# Patient Record
Sex: Male | Born: 2000 | Race: White | Hispanic: No | Marital: Single | Smoking: Never smoker
Health system: Southern US, Community
[De-identification: ages and names within clinical notes are randomized; demographics above are authoritative.]

## PROBLEM LIST (undated history)

## (undated) DIAGNOSIS — T7840XA Allergy, unspecified, initial encounter: Secondary | ICD-10-CM

## (undated) DIAGNOSIS — L309 Dermatitis, unspecified: Secondary | ICD-10-CM

## (undated) DIAGNOSIS — F909 Attention-deficit hyperactivity disorder, unspecified type: Secondary | ICD-10-CM

## (undated) DIAGNOSIS — H539 Unspecified visual disturbance: Secondary | ICD-10-CM

## (undated) HISTORY — DX: Unspecified visual disturbance: H53.9

## (undated) HISTORY — PX: TYMPANOSTOMY TUBE PLACEMENT: SHX32

## (undated) HISTORY — DX: Attention-deficit hyperactivity disorder, unspecified type: F90.9

## (undated) HISTORY — DX: Allergy, unspecified, initial encounter: T78.40XA

## (undated) HISTORY — DX: Dermatitis, unspecified: L30.9

---
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
## 2020-01-15 ENCOUNTER — Ambulatory Visit (INDEPENDENT_AMBULATORY_CARE_PROVIDER_SITE_OTHER): Payer: BLUE CROSS/BLUE SHIELD

## 2020-01-15 ENCOUNTER — Ambulatory Visit: Payer: BLUE CROSS/BLUE SHIELD | Admitting: Internal Medicine

## 2020-01-15 ENCOUNTER — Encounter: Payer: Self-pay | Admitting: Internal Medicine

## 2020-01-15 ENCOUNTER — Other Ambulatory Visit: Payer: Self-pay

## 2020-01-15 VITALS — BP 102/72 | HR 67 | Temp 98.4°F | Ht 74.0 in | Wt 220.0 lb

## 2020-01-15 DIAGNOSIS — S93402A Sprain of unspecified ligament of left ankle, initial encounter: Secondary | ICD-10-CM

## 2020-01-15 DIAGNOSIS — R439 Unspecified disturbances of smell and taste: Secondary | ICD-10-CM | POA: Diagnosis not present

## 2020-01-15 DIAGNOSIS — Z00129 Encounter for routine child health examination without abnormal findings: Secondary | ICD-10-CM | POA: Insufficient documentation

## 2020-01-15 DIAGNOSIS — Z0000 Encounter for general adult medical examination without abnormal findings: Secondary | ICD-10-CM | POA: Insufficient documentation

## 2020-01-15 LAB — BASIC METABOLIC PANEL
BUN: 15 mg/dL (ref 6–23)
CO2: 32 mEq/L (ref 19–32)
Calcium: 9.8 mg/dL (ref 8.4–10.5)
Chloride: 103 mEq/L (ref 96–112)
Creatinine, Ser: 0.96 mg/dL (ref 0.40–1.50)
GFR: 101.44 mL/min (ref >60.00)
Glucose, Bld: 89 mg/dL (ref 70–99)
Potassium: 4 mEq/L (ref 3.5–5.1)
Sodium: 140 mEq/L (ref 135–145)

## 2020-01-15 LAB — CBC WITH DIFFERENTIAL/PLATELET
Basophils Absolute: 0 10*3/uL (ref 0.0–0.1)
Basophils Relative: 0.5 % (ref 0.0–3.0)
Eosinophils Absolute: 0.1 10*3/uL (ref 0.0–0.7)
Eosinophils Relative: 2.7 % (ref 0.0–5.0)
HCT: 44.9 % (ref 36.0–49.0)
Hemoglobin: 15.5 g/dL (ref 12.0–16.0)
Lymphocytes Relative: 33.7 % (ref 24.0–48.0)
Lymphs Abs: 1.7 10*3/uL (ref 0.7–4.0)
MCHC: 34.6 g/dL (ref 31.0–37.0)
MCV: 92.1 fl (ref 78.0–98.0)
Monocytes Absolute: 0.9 10*3/uL (ref 0.1–1.0)
Monocytes Relative: 16.8 % — ABNORMAL HIGH (ref 3.0–12.0)
Neutro Abs: 2.4 10*3/uL (ref 1.4–7.7)
Neutrophils Relative %: 46.3 % (ref 43.0–71.0)
Platelets: 217 10*3/uL (ref 150.0–575.0)
RBC: 4.87 Mil/uL (ref 3.80–5.70)
RDW: 12.3 % (ref 11.4–15.5)
WBC: 5.2 10*3/uL (ref 4.5–13.5)

## 2020-01-15 LAB — URINALYSIS
Bilirubin Urine: NEGATIVE
Hgb urine dipstick: NEGATIVE
Ketones, ur: NEGATIVE
Leukocytes,Ua: NEGATIVE
Nitrite: NEGATIVE
Specific Gravity, Urine: 1.02 (ref 1.000–1.030)
Total Protein, Urine: NEGATIVE
Urine Glucose: NEGATIVE
Urobilinogen, UA: 0.2 (ref 0.0–1.0)
pH: 6.5 (ref 5.0–8.0)

## 2020-01-15 LAB — VITAMIN B12: Vitamin B-12: 254 pg/mL (ref 211–911)

## 2020-01-15 LAB — HEPATIC FUNCTION PANEL
ALT: 22 U/L (ref 0–53)
AST: 17 U/L (ref 0–37)
Albumin: 4.3 g/dL (ref 3.5–5.2)
Alkaline Phosphatase: 73 U/L (ref 52–171)
Bilirubin, Direct: 0.1 mg/dL (ref 0.0–0.3)
Total Bilirubin: 0.4 mg/dL (ref 0.3–1.2)
Total Protein: 6.9 g/dL (ref 6.0–8.3)

## 2020-01-15 LAB — LIPID PANEL
Cholesterol: 113 mg/dL (ref 0–200)
HDL: 36.6 mg/dL — ABNORMAL LOW (ref >39.00)
LDL Cholesterol: 68 mg/dL (ref 0–99)
NonHDL: 76.11
Total CHOL/HDL Ratio: 3
Triglycerides: 40 mg/dL (ref 0.0–149.0)
VLDL: 8 mg/dL (ref 0.0–40.0)

## 2020-01-15 LAB — VITAMIN D 25 HYDROXY (VIT D DEFICIENCY, FRACTURES): VITD: 32.18 ng/mL (ref 30.00–100.00)

## 2020-01-15 LAB — TSH: TSH: 1.12 u[IU]/mL (ref 0.40–5.00)

## 2020-01-15 MED ORDER — VITAMIN D3 50 MCG (2000 UT) PO CAPS: 2000.0000 [IU] | ORAL_CAPSULE | Freq: Every day | ORAL | 3 refills | Status: DC

## 2020-01-15 MED ORDER — B COMPLEX PO TABS: 1.0000 | ORAL_TABLET | Freq: Every day | ORAL | 3 refills | Status: DC

## 2020-01-15 NOTE — Assessment & Plan Note (Signed)
post-COVID19 B complex Labs

## 2020-01-15 NOTE — Assessment & Plan Note (Addendum)
Labs Vit D We discussed age appropriate health related issues, including available/recomended screening tests and vaccinations. We discussed a need for adhering to healthy diet and exercise. Labs were reviewed/ordered. All questions were answered. Age and sex related issues discussed (safe sex, seat belt use, etc.).

## 2020-01-15 NOTE — Progress Notes (Signed)
Subjective:  Patient ID: Shane Stephens, male    DOB: [birth year]  Age: 19 y.o. MRN: [id number]  CC: New Patient (Initial Visit)   HPI Shane Stephens presents for a new pt visit - well exam Pt had COVID19 6 wks ago C/o L ankle sprain - severe Torn ACL L - s/p surgery  Outpatient Medications Prior to Visit  Medication Sig Dispense Refill  . neomycin-polymyxin-hydrocortisone (CORTISPORIN) OTIC solution Apply 1-2 drops to toe after soaking twice a day 10 mL 0   No facility-administered medications prior to visit.    ROS: Review of Systems  Constitutional: Negative for appetite change, fatigue and unexpected weight change.  HENT: Negative for congestion, nosebleeds, sneezing, sore throat and trouble swallowing.   Eyes: Negative for itching and visual disturbance.  Respiratory: Negative for cough.   Cardiovascular: Negative for chest pain, palpitations and leg swelling.  Gastrointestinal: Negative for abdominal distention, blood in stool, diarrhea and nausea.  Genitourinary: Negative for frequency and hematuria.  Musculoskeletal: Positive for gait problem. Negative for back pain, joint swelling and neck pain.  Skin: Negative for rash.  Neurological: Negative for dizziness, tremors, speech difficulty and weakness.  Psychiatric/Behavioral: Negative for agitation, dysphoric mood and sleep disturbance. The patient is not nervous/anxious.     Objective:  BP 102/72 (BP Location: Left Arm)   Pulse 67   Temp 98.4 F (36.9 C) (Oral)   Ht 6\' 2"  (1.88 m)   Wt 220 lb (99.8 kg)   SpO2 97%   BMI 28.25 kg/m   BP Readings from Last 3 Encounters:  01/15/20 102/72  06/06/19 114/67    Wt Readings from Last 3 Encounters:  01/15/20 220 lb (99.8 kg) (97 %, Z= 1.94)*   * Growth percentiles are based on CDC (Boys, 2-20 Years) data.    Physical Exam Constitutional:      General: He is not in acute distress.    Appearance: He is well-developed.     Comments: NAD  Eyes:   Conjunctiva/sclera: Conjunctivae normal.     Pupils: Pupils are equal, round, and reactive to light.  Neck:     Thyroid: No thyromegaly.     Vascular: No JVD.  Cardiovascular:     Rate and Rhythm: Normal rate and regular rhythm.     Heart sounds: Normal heart sounds. No murmur. No friction rub. No gallop.   Pulmonary:     Effort: Pulmonary effort is normal. No respiratory distress.     Breath sounds: Normal breath sounds. No wheezing or rales.  Chest:     Chest wall: No tenderness.  Abdominal:     General: Bowel sounds are normal. There is no distension.     Palpations: Abdomen is soft. There is no mass.     Tenderness: There is no abdominal tenderness. There is no guarding or rebound.  Musculoskeletal:        General: No tenderness. Normal range of motion.     Cervical back: Normal range of motion.  Lymphadenopathy:     Cervical: No cervical adenopathy.  Skin:    General: Skin is warm and dry.     Findings: No rash.  Neurological:     Mental Status: He is alert and oriented to person, place, and time.     Cranial Nerves: No cranial nerve deficit.     Motor: No abnormal muscle tone.     Coordination: Coordination normal.     Gait: Gait normal.     Deep Tendon Reflexes: Reflexes  are normal and symmetric.  Psychiatric:        Behavior: Behavior normal.        Thought Content: Thought content normal.        Judgment: Judgment normal.   in a surg boot L ankle w/lat swelling  Testes - self exam  No results found for: WBC, HGB, HCT, PLT, GLUCOSE, CHOL, TRIG, HDL, LDLDIRECT, LDLCALC, ALT, AST, NA, K, CL, CREATININE, BUN, CO2, TSH, PSA, INR, GLUF, HGBA1C, MICROALBUR  No results found.  Assessment & Plan:     Follow-up: No follow-ups on file.  Sonda Primes, MD

## 2020-01-15 NOTE — Assessment & Plan Note (Signed)
Boot Ankle X ray

## 2020-01-20 ENCOUNTER — Encounter: Payer: Self-pay | Admitting: Internal Medicine

## 2020-07-10 ENCOUNTER — Ambulatory Visit: Payer: BLUE CROSS/BLUE SHIELD | Admitting: Family

## 2020-08-03 ENCOUNTER — Other Ambulatory Visit: Payer: Self-pay

## 2020-08-03 ENCOUNTER — Ambulatory Visit (INDEPENDENT_AMBULATORY_CARE_PROVIDER_SITE_OTHER): Payer: BLUE CROSS/BLUE SHIELD | Admitting: Family

## 2020-08-03 VITALS — HR 65 | Temp 98.7°F

## 2020-08-03 DIAGNOSIS — K649 Unspecified hemorrhoids: Secondary | ICD-10-CM | POA: Diagnosis not present

## 2020-08-03 DIAGNOSIS — J069 Acute upper respiratory infection, unspecified: Secondary | ICD-10-CM

## 2020-08-03 MED ORDER — HYDROCORT-PRAMOXINE (PERIANAL) 1-1 % EX FOAM: 1.0000 | Freq: Three times a day (TID) | CUTANEOUS | 0 refills | Status: DC

## 2020-08-03 NOTE — Progress Notes (Signed)
Shane Stephens is a 19 y.o. male with the following history as recorded in [employer]:  Patient Active Problem List   Diagnosis Date Noted  . 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 Well adolescent visit 01/15/2020  . Muscle weakness 05/30/2019  . History of reconstruction of anterior cruciate ligament tear 05/10/2019  . Pain in left knee 10/15/2018  . Left ankle sprain 02/21/2018  . ADHD (attention deficit hyperactivity disorder), combined type 02/15/2016  . Developmental dysgraphia 02/15/2016    Current Outpatient Medications  Medication Sig Dispense Refill  . b complex vitamins tablet Take 1 tablet by mouth daily. 100 tablet 3  . Cholecalciferol (VITAMIN D3) 50 MCG (2000 UT) capsule Take 1 capsule (2,000 Units total) by mouth daily. 100 capsule 3  . hydrocortisone-pramoxine (PROCTOFOAM-HC) rectal foam Place 1 applicator rectally 3 (three) times daily. 10 g 0   No current facility-administered medications for this visit.    Allergies: Cat hair extract  Past Medical History:  Diagnosis Date  . ADHD (attention deficit hyperactivity disorder)   . Allergy    environmental allergies  . Eczema   . Vision abnormalities    myopia    Past Surgical History:  Procedure Laterality Date  . TYMPANOSTOMY TUBE PLACEMENT     1 yr    Family History  Problem Relation Age of Onset  . Hypertension Maternal Aunt   . Hypertension Maternal Grandmother   . Heart disease Maternal Grandmother   . Stroke Maternal Grandmother   . Depression Maternal Grandfather   . Arthritis Maternal Aunt   . Fibromyalgia Maternal Aunt     Social History   Tobacco Use  . Smoking status: Never Smoker  . Smokeless tobacco: Never Used  Substance Use Topics  . Alcohol use: No    Alcohol/week: 0.0 standard drinks    Subjective:   I connected with Shane Stephens on 08/03/20 at  9:00 AM EDT by a video enabled telemedicine application and verified that I am speaking with the correct person using two  identifiers.   I discussed the limitations of evaluation and management by telemedicine and the availability of in person appointments. The patient expressed understanding and agreed to proceed. Provider in office/ patient is at home; provider and patient are only 2 people on video call.   1) Concerned for hemorrhoids; having some pain/ bright bleeding with bowel movements x 1 months; does admits to some occasional straining; no prior history of problems with hemorrhoids; no OTC medications tried;  2) Complaining of sore throat, cough/ runny nose x 1 week; is fully vaccinated; notes that suite mates have similar symptoms; one tested negative; feels like it is "just a normal cold." No fever, chest pain, shortness of breath;       Objective:  Vitals:   08/03/20 0903  Pulse: 65  Temp: 98.7 F (37.1 C)  TempSrc: Oral  SpO2: 97%    General: Well developed, well nourished, in no acute distress  Head: Normocephalic and atraumatic  Lungs: Respirations unlabored;  Neurologic: Alert and oriented; speech intact; face symmetrical; moves all extremities well; CNII-XII intact without focal deficit   Assessment:  1. Viral URI   2. Hemorrhoids, unspecified hemorrhoid type     Plan:  1. Due to patient's symptoms and increased risk for exposure due to recent attendance at large football game with no mask, will go ahead and test for COVID; symptomatic treatment discussed; 2. Rx for Proctofoam HC apply tid; need for healthy diet, increased water; follow-up  worse, no better.   No follow-ups on file.  Orders Placed This Encounter  Procedures  . Novel Coronavirus, NAA ([facility])    Order Specific Question:   Is this test for diagnosis or screening    Answer:   Diagnosis of ill patient    Order Specific Question:   Symptomatic for COVID-19 as defined by CDC    Answer:   Yes    Order Specific Question:   Date of Symptom Onset    Answer:   07/27/2020    Order Specific Question:   Hospitalized for  COVID-19    Answer:   No    Order Specific Question:   Admitted to ICU for COVID-19    Answer:   No    Order Specific Question:   Previously tested for COVID-19    Answer:   Yes    Order Specific Question:   Resident in a congregate (group) care setting    Answer:   No    Order Specific Question:   Is the patient student?    Answer:   Yes    Order Specific Question:   Employed in healthcare setting    Answer:   No    Order Specific Question:   Has patient completed COVID vaccination(s) (2 doses of Pfizer/Moderna 1 dose of [employer])    Answer:   Yes    Requested Prescriptions   Signed Prescriptions Disp Refills  . hydrocortisone-pramoxine (PROCTOFOAM-HC) rectal foam 10 g 0    Sig: Place 1 applicator rectally 3 (three) times daily.

## 2020-08-05 LAB — NOVEL CORONAVIRUS, NAA: SARS-CoV-2, NAA: NOT DETECTED

## 2020-08-05 LAB — SARS-COV-2, NAA 2 DAY TAT

## 2021-05-26 IMAGING — DX DG ANKLE COMPLETE 3+V*L*
3 series · 3 of 3 positions shown · non-contrast
Comparison: None.

CLINICAL DATA: Left ankle injury

EXAM:
LEFT ANKLE COMPLETE - 3+ VIEW

[ankle ap]
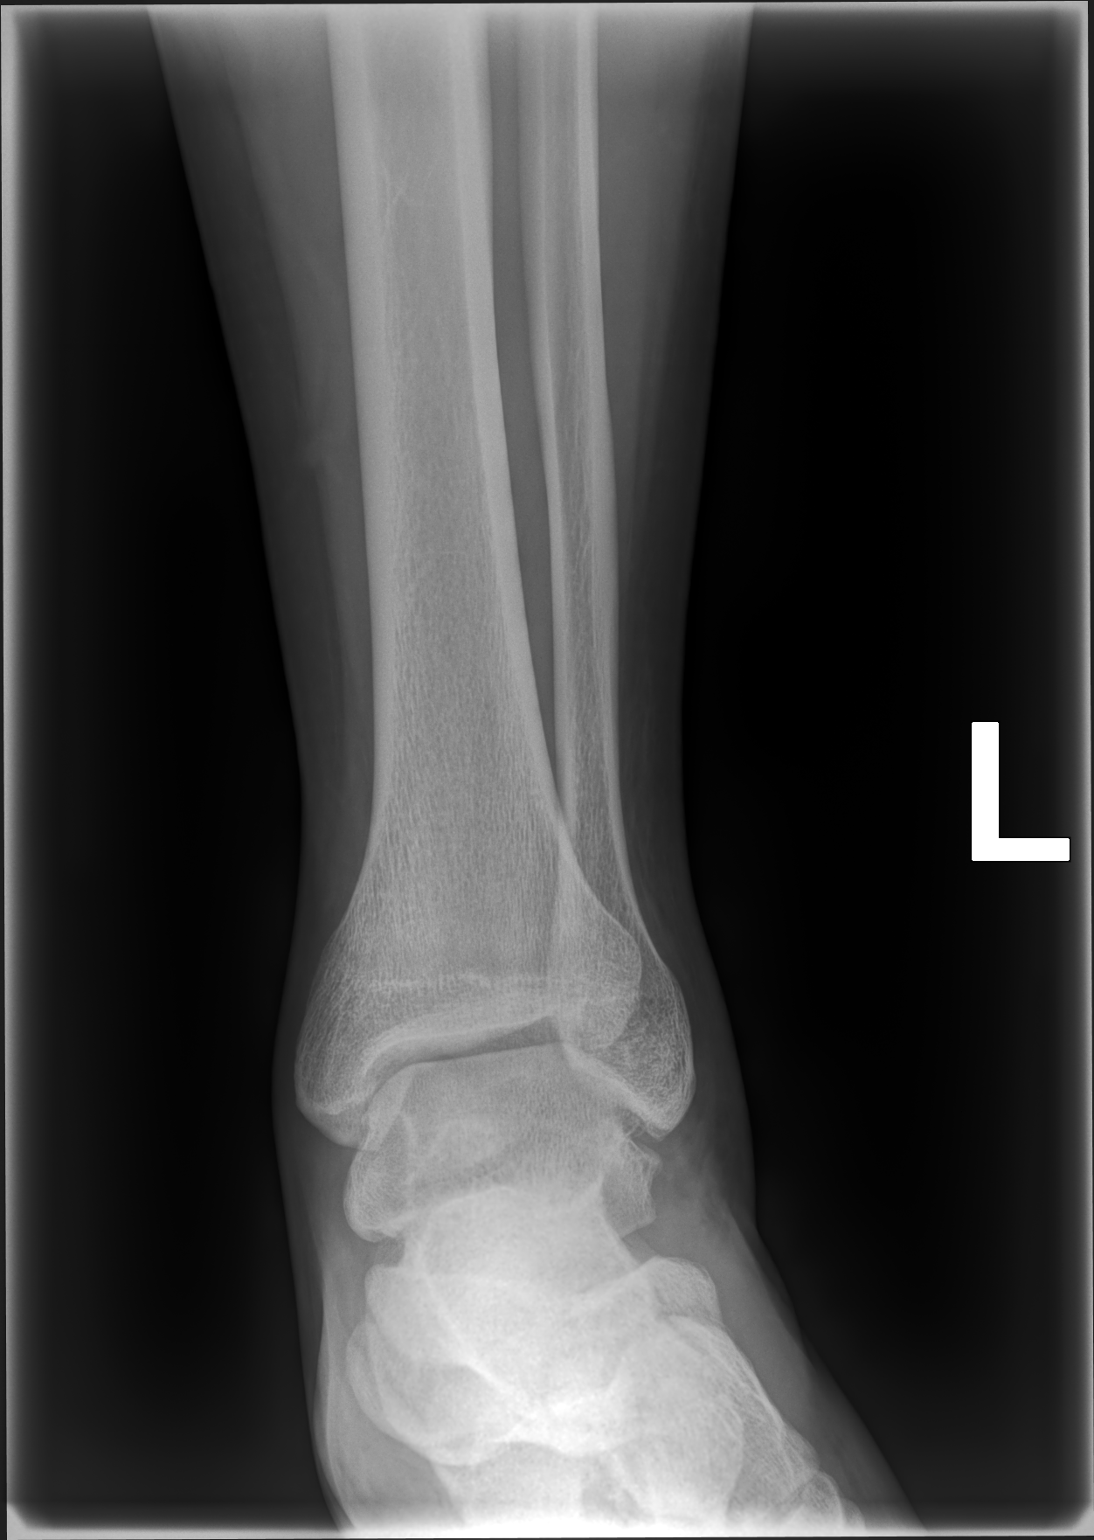

[ankle mlo]
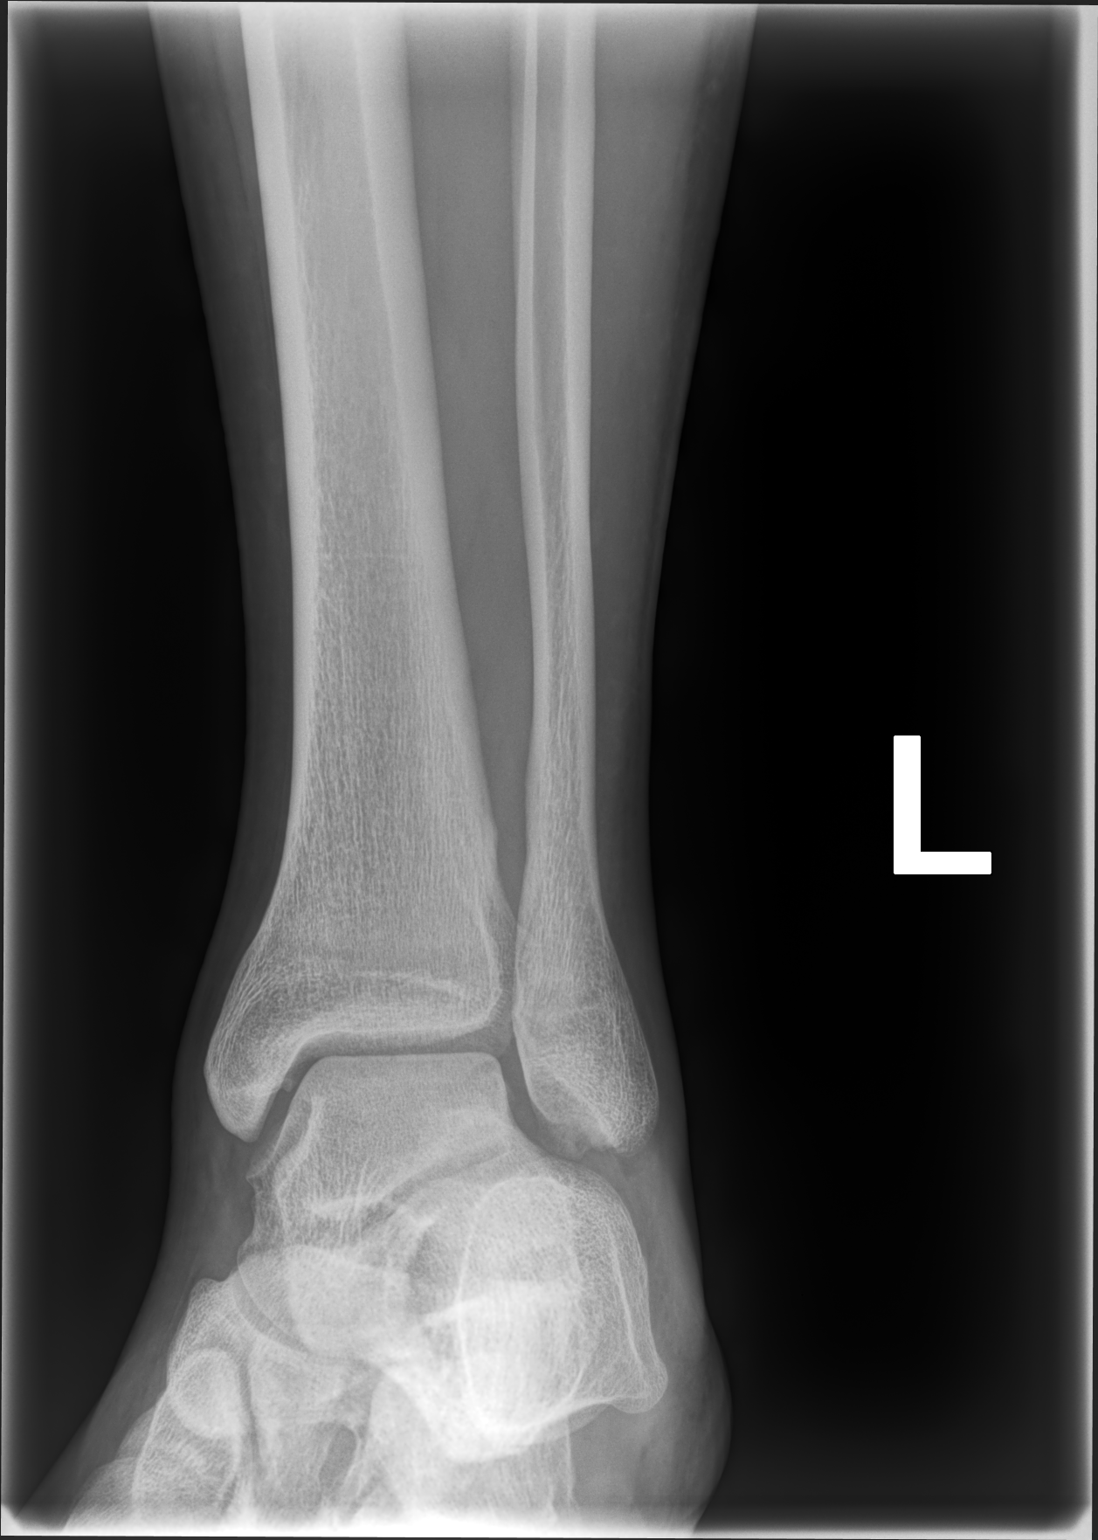

[ankle lat]
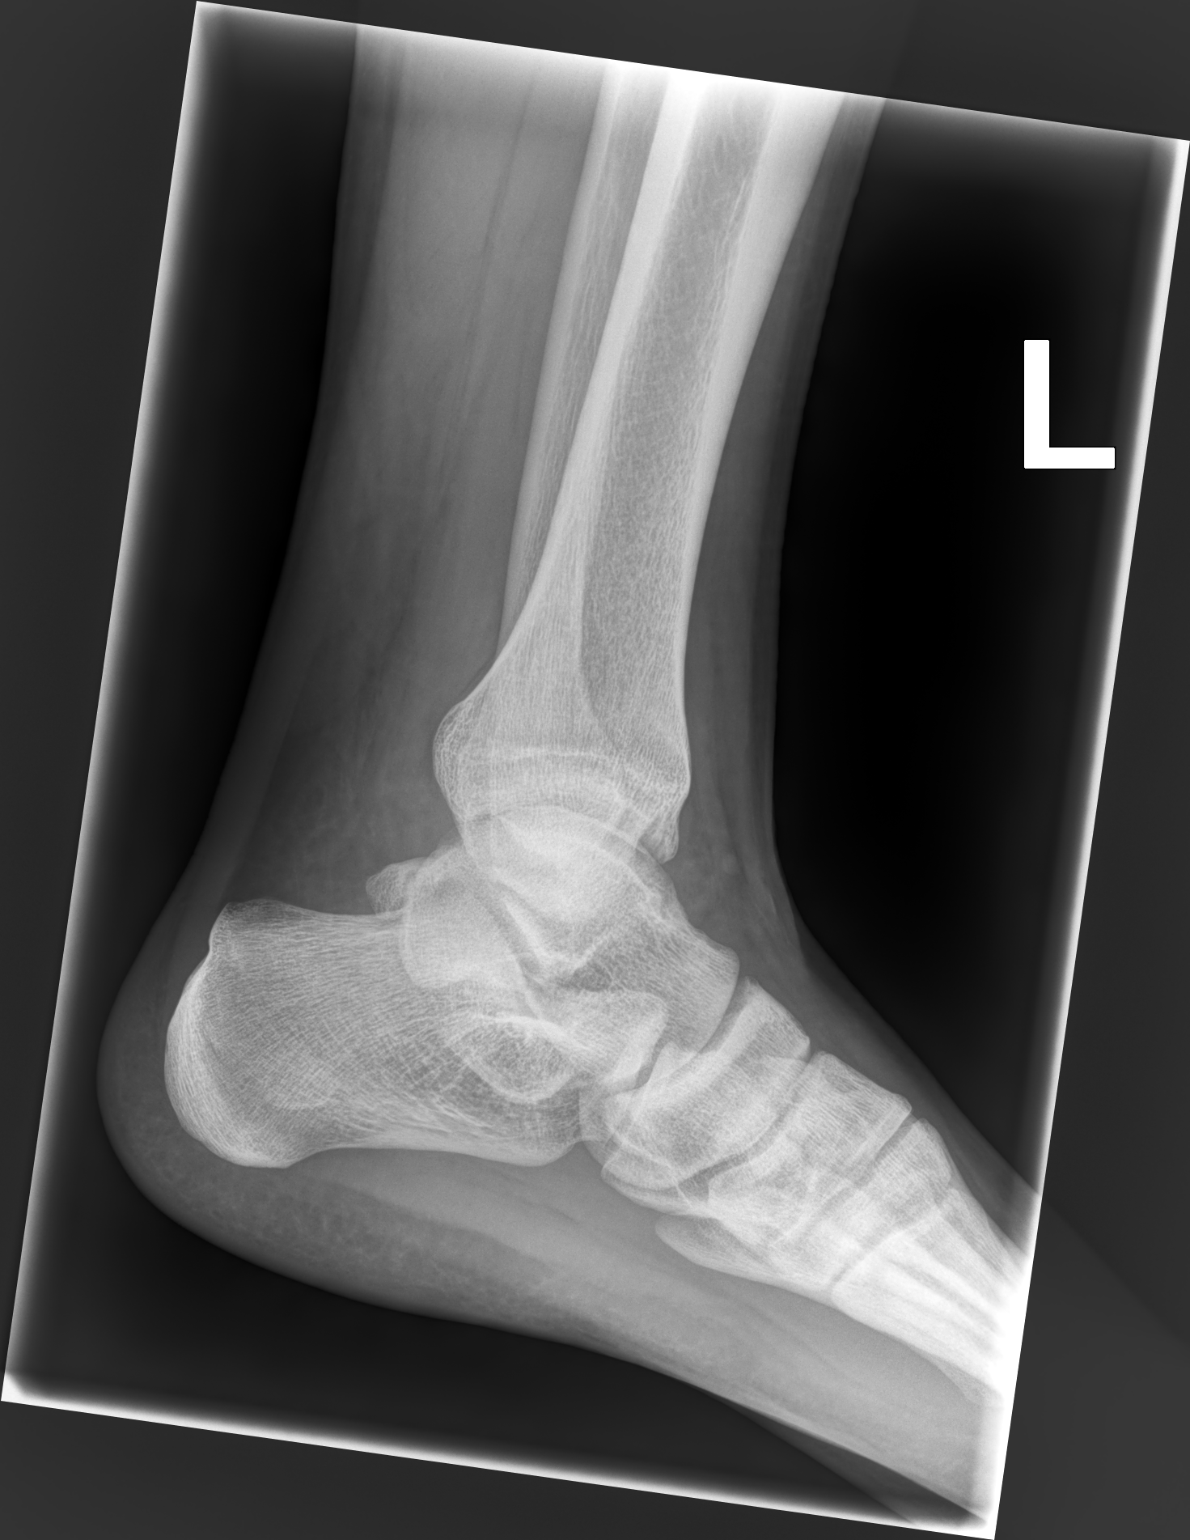

[3 of 3 positions shown; findings below may reference images not displayed]

FINDINGS: There is no evidence of fracture, dislocation, or joint effusion.
There is no evidence of arthropathy or other focal bone abnormality.
Soft tissues are unremarkable.
IMPRESSION: Negative.

## 2023-05-31 ENCOUNTER — Ambulatory Visit (INDEPENDENT_AMBULATORY_CARE_PROVIDER_SITE_OTHER): Payer: BLUE CROSS/BLUE SHIELD | Admitting: Internal Medicine

## 2023-05-31 ENCOUNTER — Encounter: Payer: Self-pay | Admitting: Internal Medicine

## 2023-05-31 VITALS — BP 120/82 | HR 66 | Temp 98.6°F | Ht 74.0 in | Wt 261.0 lb

## 2023-05-31 DIAGNOSIS — J3081 Allergic rhinitis due to animal (cat) (dog) hair and dander: Secondary | ICD-10-CM | POA: Diagnosis not present

## 2023-05-31 DIAGNOSIS — M238X9 Other internal derangements of unspecified knee: Secondary | ICD-10-CM

## 2023-05-31 DIAGNOSIS — Z0000 Encounter for general adult medical examination without abnormal findings: Secondary | ICD-10-CM

## 2023-05-31 DIAGNOSIS — M25562 Pain in left knee: Secondary | ICD-10-CM

## 2023-05-31 DIAGNOSIS — Z13220 Encounter for screening for lipoid disorders: Secondary | ICD-10-CM

## 2023-05-31 DIAGNOSIS — Z0001 Encounter for general adult medical examination with abnormal findings: Secondary | ICD-10-CM

## 2023-05-31 DIAGNOSIS — G8929 Other chronic pain: Secondary | ICD-10-CM

## 2023-05-31 LAB — URINALYSIS
Bilirubin Urine: NEGATIVE
Hgb urine dipstick: NEGATIVE
Ketones, ur: NEGATIVE
Leukocytes,Ua: NEGATIVE
Nitrite: NEGATIVE
Specific Gravity, Urine: 1.025 (ref 1.000–1.030)
Total Protein, Urine: NEGATIVE
Urine Glucose: NEGATIVE
Urobilinogen, UA: 0.2 (ref 0.0–1.0)
pH: 6 (ref 5.0–8.0)

## 2023-05-31 LAB — LIPID PANEL
Cholesterol: 122 mg/dL (ref 0–200)
HDL: 28.8 mg/dL — ABNORMAL LOW (ref >39.00)
LDL Cholesterol: 87 mg/dL (ref 0–99)
NonHDL: 93.45
Total CHOL/HDL Ratio: 4
Triglycerides: 33 mg/dL (ref 0.0–149.0)
VLDL: 6.6 mg/dL (ref 0.0–40.0)

## 2023-05-31 LAB — CBC WITH DIFFERENTIAL/PLATELET
Basophils Absolute: 0 10*3/uL (ref 0.0–0.1)
Basophils Relative: 0.5 % (ref 0.0–3.0)
Eosinophils Absolute: 0.1 10*3/uL (ref 0.0–0.7)
Eosinophils Relative: 2 % (ref 0.0–5.0)
HCT: 43.3 % (ref 39.0–52.0)
Hemoglobin: 14.9 g/dL (ref 13.0–17.0)
Lymphocytes Relative: 36.9 % (ref 12.0–46.0)
Lymphs Abs: 2.2 10*3/uL (ref 0.7–4.0)
MCHC: 34.4 g/dL (ref 30.0–36.0)
MCV: 90.1 fl (ref 78.0–100.0)
Monocytes Absolute: 0.7 10*3/uL (ref 0.1–1.0)
Monocytes Relative: 11.2 % (ref 3.0–12.0)
Neutro Abs: 3 10*3/uL (ref 1.4–7.7)
Neutrophils Relative %: 49.4 % (ref 43.0–77.0)
Platelets: 284 10*3/uL (ref 150.0–400.0)
RBC: 4.81 Mil/uL (ref 4.22–5.81)
RDW: 12.2 % (ref 11.5–15.5)
WBC: 6 10*3/uL (ref 4.0–10.5)

## 2023-05-31 LAB — VITAMIN D 25 HYDROXY (VIT D DEFICIENCY, FRACTURES): VITD: 28.77 ng/mL — ABNORMAL LOW (ref 30.00–100.00)

## 2023-05-31 LAB — COMPREHENSIVE METABOLIC PANEL
ALT: 28 U/L (ref 0–53)
AST: 21 U/L (ref 0–37)
Albumin: 4.3 g/dL (ref 3.5–5.2)
Alkaline Phosphatase: 73 U/L (ref 39–117)
BUN: 15 mg/dL (ref 6–23)
CO2: 28 mEq/L (ref 19–32)
Calcium: 9.5 mg/dL (ref 8.4–10.5)
Chloride: 106 mEq/L (ref 96–112)
Creatinine, Ser: 0.96 mg/dL (ref 0.40–1.50)
GFR: 112.69 mL/min (ref >60.00)
Glucose, Bld: 95 mg/dL (ref 70–99)
Potassium: 4.2 mEq/L (ref 3.5–5.1)
Sodium: 140 mEq/L (ref 135–145)
Total Bilirubin: 0.5 mg/dL (ref 0.2–1.2)
Total Protein: 7.2 g/dL (ref 6.0–8.3)

## 2023-05-31 LAB — TSH: TSH: 0.55 u[IU]/mL (ref 0.35–5.50)

## 2023-05-31 MED ORDER — LORATADINE 10 MG PO TABS: 10.0000 mg | ORAL_TABLET | Freq: Every day | ORAL | Status: AC

## 2023-05-31 MED ORDER — VITAMIN D3 50 MCG (2000 UT) PO CAPS: 2000.0000 [IU] | ORAL_CAPSULE | Freq: Every day | ORAL | Status: AC

## 2023-05-31 NOTE — Progress Notes (Signed)
Subjective:  Patient ID: Shane Stephens, male    DOB: 2001/03/22  Age: 22 y.o. MRN: [id number]  CC: New Patient (Initial Visit)   HPI Shane Stephens presents for a well exam Occ R knee pain Not seen in>3 years  Outpatient Medications Prior to Visit  Medication Sig Dispense Refill   azelastine (OPTIVAR) 0.05 % ophthalmic solution 1 drop into affected eye Ophthalmic Twice a day     cetirizine (ZYRTEC ALLERGY) 10 MG tablet 1 tablet Orally Once a day     hydrocortisone-pramoxine (PROCTOFOAM-HC) rectal foam Place 1 applicator rectally 3 (three) times daily. 10 g 0   trimethoprim-polymyxin b (POLYTRIM) ophthalmic solution 1 drop 4 (four) times daily.     zolpidem (AMBIEN) 10 MG tablet Take 10 mg by mouth at bedtime.     No facility-administered medications prior to visit.    ROS: Review of Systems  Constitutional:  Negative for appetite change, fatigue and unexpected weight change.  HENT:  Negative for congestion, nosebleeds, sneezing, sore throat and trouble swallowing.   Eyes:  Negative for itching and visual disturbance.  Respiratory:  Negative for cough.   Cardiovascular:  Negative for chest pain, palpitations and leg swelling.  Gastrointestinal:  Negative for abdominal distention, blood in stool, diarrhea and nausea.  Genitourinary:  Negative for frequency and hematuria.  Musculoskeletal:  Negative for back pain, gait problem, joint swelling and neck pain.  Skin:  Negative for rash.  Neurological:  Negative for dizziness, tremors, speech difficulty and weakness.  Psychiatric/Behavioral:  Negative for agitation, dysphoric mood and sleep disturbance. The patient is not nervous/anxious.     Objective:  BP 120/82 (BP Location: Left Arm, Patient Position: Sitting, Cuff Size: Large)   Pulse 66   Temp 98.6 F (37 C) (Oral)   Ht 6\' 2"  (1.88 m)   Wt 261 lb (118.4 kg)   SpO2 96%   BMI 33.51 kg/m   BP Readings from Last 3 Encounters:  05/31/23 120/82  01/15/20 102/72   06/06/19 114/67    Wt Readings from Last 3 Encounters:  05/31/23 261 lb (118.4 kg)  01/15/20 220 lb (99.8 kg) (97%, Z= 1.94)*   * Growth percentiles are based on CDC (Boys, 2-20 Years) data.    Physical Exam Constitutional:      General: He is not in acute distress.    Appearance: He is well-developed.     Comments: NAD  Eyes:     Conjunctiva/sclera: Conjunctivae normal.     Pupils: Pupils are equal, round, and reactive to light.  Neck:     Thyroid: No thyromegaly.     Vascular: No JVD.  Cardiovascular:     Rate and Rhythm: Normal rate and regular rhythm.     Heart sounds: Normal heart sounds. No murmur heard.    No friction rub. No gallop.  Pulmonary:     Effort: Pulmonary effort is normal. No respiratory distress.     Breath sounds: Normal breath sounds. No wheezing or rales.  Chest:     Chest wall: No tenderness.  Abdominal:     General: Bowel sounds are normal. There is no distension.     Palpations: Abdomen is soft. There is no mass.     Tenderness: There is no abdominal tenderness. There is no guarding or rebound.  Musculoskeletal:        General: No tenderness. Normal range of motion.     Cervical back: Normal range of motion.  Lymphadenopathy:     Cervical: No  cervical adenopathy.  Skin:    General: Skin is warm and dry.     Findings: No rash.  Neurological:     Mental Status: He is alert and oriented to person, place, and time.     Cranial Nerves: No cranial nerve deficit.     Motor: No abnormal muscle tone.     Coordination: Coordination normal.     Gait: Gait normal.     Deep Tendon Reflexes: Reflexes are normal and symmetric.  Psychiatric:        Behavior: Behavior normal.        Thought Content: Thought content normal.        Judgment: Judgment normal.   Testes - self exam  Lab Results  Component Value Date   WBC 5.2 01/15/2020   HGB 15.5 01/15/2020   HCT 44.9 01/15/2020   PLT 217.0 01/15/2020   GLUCOSE 89 01/15/2020   CHOL 113 01/15/2020    TRIG 40.0 01/15/2020   HDL 36.60 (L) 01/15/2020   LDLCALC 68 01/15/2020   ALT 22 01/15/2020   AST 17 01/15/2020   NA 140 01/15/2020   K 4.0 01/15/2020   CL 103 01/15/2020   CREATININE 0.96 01/15/2020   BUN 15 01/15/2020   CO2 32 01/15/2020   TSH 1.12 01/15/2020    No results found.  Assessment & Plan:   Problem List Items Addressed This Visit     Pain in left knee   Relevant Orders   VITAMIN D 25 Hydroxy (Vit-D Deficiency, Fractures)   Well adult exam - Primary    We discussed age appropriate health related issues, including available/recomended screening tests and vaccinations. We discussed a need for adhering to healthy diet and exercise. Labs were reviewed/ordered. All questions were answered. Age and sex related issues discussed (safe sex, seat belt use, etc.).  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   loratadine (CLARITIN) 10 MG tablet    Sig: Take 1 tablet (10 mg total) by mouth daily.      Follow-up: Return in about 1 year (around 05/30/2024) for Wellness Exam.  Sonda Primes, MD

## 2023-05-31 NOTE — Assessment & Plan Note (Signed)
R and L s/p repair

## 2023-05-31 NOTE — Assessment & Plan Note (Signed)
Take Loratadine qd

## 2023-05-31 NOTE — Assessment & Plan Note (Addendum)
We discussed age appropriate health related issues, including available/recomended screening tests and vaccinations. We discussed a need for adhering to healthy diet and exercise. Labs were reviewed/ordered. All questions were answered. Age and sex related issues discussed (safe sex, seat belt use, etc.).  Vit D daily

## 2023-06-01 ENCOUNTER — Encounter: Payer: Self-pay | Admitting: Internal Medicine

## 2023-06-01 DIAGNOSIS — E559 Vitamin D deficiency, unspecified: Secondary | ICD-10-CM | POA: Insufficient documentation

## 2023-12-25 ENCOUNTER — Ambulatory Visit (INDEPENDENT_AMBULATORY_CARE_PROVIDER_SITE_OTHER): Payer: BLUE CROSS/BLUE SHIELD | Admitting: Podiatry

## 2023-12-25 ENCOUNTER — Encounter: Payer: Self-pay | Admitting: Podiatry

## 2023-12-25 ENCOUNTER — Ambulatory Visit (INDEPENDENT_AMBULATORY_CARE_PROVIDER_SITE_OTHER): Payer: BLUE CROSS/BLUE SHIELD

## 2023-12-25 DIAGNOSIS — M7672 Peroneal tendinitis, left leg: Secondary | ICD-10-CM | POA: Diagnosis not present

## 2023-12-25 DIAGNOSIS — M79672 Pain in left foot: Secondary | ICD-10-CM

## 2023-12-25 MED ORDER — DICLOFENAC SODIUM 75 MG PO TBEC: 75.0000 mg | DELAYED_RELEASE_TABLET | Freq: Two times a day (BID) | ORAL | 2 refills | Status: AC

## 2023-12-25 MED ORDER — TRIAMCINOLONE ACETONIDE 10 MG/ML IJ SUSP
10.0000 mg | Freq: Once | INTRAMUSCULAR | Status: AC
Start: 2023-12-25 — End: 2023-12-25
  Administered 2023-12-25: 10 mg via INTRA_ARTICULAR

## 2023-12-25 NOTE — Progress Notes (Signed)
 Subjective:   Patient ID: Shane Stephens, male   DOB: 23 y.o.   MRN: [id number]   HPI Patient states she has had a lot of pain in the outside of his left foot for the last few weeks and does not remember specific injury.  States that it is worse as he walks more and seems to get better as he is off of it.  Patient does not smoke likes to be active   Review of Systems  All other systems reviewed and are negative.       Objective:  Physical Exam Vitals and nursing note reviewed.  Constitutional:      Appearance: He is well-developed.  Pulmonary:     Effort: Pulmonary effort is normal.  Musculoskeletal:        General: Normal range of motion.  Skin:    General: Skin is warm.  Neurological:     Mental Status: He is alert.     Neurovascular status intact muscle strength within normal limits range of motion within normal limits with patient found to have inflammation pain on the lateral side left foot painful when pressed at the peroneal insertion base of fifth metatarsal with mild edema around the area and no indication of tendon dysfunction     Assessment:  Appears to be peroneal tendinitis left with inflammation at the insertion base of fifth metatarsal     Plan:  H&P reviewed went ahead today sterile prep and injected the tendon sheath to the base 3 mg Dexasone Kenalog 5 mg Xylocaine after explaining risk.  Discussed ice and wearing supportive shoes and reappoint as symptoms indicate.  Placed on diclofenac 75 mg twice daily  X-rays were negative for fracture appears to be soft tissue pathology currently

## 2023-12-28 ENCOUNTER — Ambulatory Visit: Payer: BLUE CROSS/BLUE SHIELD | Admitting: Podiatry

## 2024-05-30 ENCOUNTER — Encounter: Payer: BLUE CROSS/BLUE SHIELD | Admitting: Internal Medicine

## 2024-06-05 ENCOUNTER — Ambulatory Visit: Admitting: Internal Medicine

## 2024-06-05 ENCOUNTER — Encounter: Payer: Self-pay | Admitting: Internal Medicine

## 2024-06-05 VITALS — Ht 74.0 in | Wt 288.0 lb

## 2024-06-05 DIAGNOSIS — Z0000 Encounter for general adult medical examination without abnormal findings: Secondary | ICD-10-CM | POA: Diagnosis not present

## 2024-06-05 DIAGNOSIS — T50B95A Adverse effect of other viral vaccines, initial encounter: Secondary | ICD-10-CM

## 2024-06-05 DIAGNOSIS — E559 Vitamin D deficiency, unspecified: Secondary | ICD-10-CM

## 2024-06-05 DIAGNOSIS — J3089 Other allergic rhinitis: Secondary | ICD-10-CM | POA: Insufficient documentation

## 2024-06-05 DIAGNOSIS — R635 Abnormal weight gain: Secondary | ICD-10-CM

## 2024-06-05 DIAGNOSIS — J3081 Allergic rhinitis due to animal (cat) (dog) hair and dander: Secondary | ICD-10-CM | POA: Insufficient documentation

## 2024-06-05 DIAGNOSIS — H1045 Other chronic allergic conjunctivitis: Secondary | ICD-10-CM | POA: Insufficient documentation

## 2024-06-05 DIAGNOSIS — J301 Allergic rhinitis due to pollen: Secondary | ICD-10-CM | POA: Insufficient documentation

## 2024-06-05 NOTE — Patient Instructions (Signed)
 [employer] book - Architectural technologist

## 2024-06-05 NOTE — Assessment & Plan Note (Signed)
 Check Vit D

## 2024-06-05 NOTE — Assessment & Plan Note (Addendum)
 We discussed age appropriate health related issues, including available/recomended screening tests and vaccinations. We discussed a need for adhering to healthy diet and exercise. Labs were reviewed/ordered. All questions were answered. Age and sex related issues discussed (safe sex, seat belt use, etc.).  Vit D daily Wt loss discussed Cut back on carbs Intermittent fasting Rabies Ab titer per pt's request (h/o vaccination)

## 2024-06-05 NOTE — Progress Notes (Signed)
 Subjective:  Patient ID: Shane Stephens, male    DOB: Aug 29, 2001  Age: 23 y.o. MRN: [id number]  CC: Annual Exam   HPI Shane Stephens presents for a well exam No OSA, some snoring  Outpatient Medications Prior to Visit  Medication Sig Dispense Refill   Cholecalciferol (VITAMIN D3) 50 MCG (2000 UT) capsule Take 1 capsule (2,000 Units total) by mouth daily.     diclofenac  (VOLTAREN ) 75 MG EC tablet Take 1 tablet (75 mg total) by mouth 2 (two) times daily. 50 tablet 2   loratadine  (CLARITIN ) 10 MG tablet Take 1 tablet (10 mg total) by mouth daily.     No facility-administered medications prior to visit.    ROS: Review of Systems  Constitutional:  Positive for unexpected weight change. Negative for appetite change and fatigue.  HENT:  Positive for congestion. Negative for nosebleeds, sneezing, sore throat and trouble swallowing.   Eyes:  Negative for itching and visual disturbance.  Respiratory:  Negative for cough.   Cardiovascular:  Negative for chest pain, palpitations and leg swelling.  Gastrointestinal:  Negative for abdominal distention, blood in stool, diarrhea and nausea.  Genitourinary:  Negative for frequency and hematuria.  Musculoskeletal:  Negative for back pain, gait problem, joint swelling and neck pain.  Skin:  Negative for rash.  Neurological:  Negative for dizziness, tremors, speech difficulty and weakness.  Psychiatric/Behavioral:  Negative for agitation, dysphoric mood and sleep disturbance. The patient is not nervous/anxious.     Objective:  Ht 6' 2 (1.88 m)   Wt 288 lb (130.6 kg)   BMI 36.98 kg/m   BP Readings from Last 3 Encounters:  05/31/23 120/82  01/15/20 102/72  06/06/19 114/67    Wt Readings from Last 3 Encounters:  06/05/24 288 lb (130.6 kg)  05/31/23 261 lb (118.4 kg)  01/15/20 220 lb (99.8 kg) (97%, Z= 1.94)*   * Growth percentiles are based on CDC (Boys, 2-20 Years) data.    Physical Exam Constitutional:      General: He is  not in acute distress.    Appearance: He is well-developed.     Comments: NAD  Eyes:     Conjunctiva/sclera: Conjunctivae normal.     Pupils: Pupils are equal, round, and reactive to light.  Neck:     Thyroid: No thyromegaly.     Vascular: No JVD.  Cardiovascular:     Rate and Rhythm: Normal rate and regular rhythm.     Heart sounds: Normal heart sounds. No murmur heard.    No friction rub. No gallop.  Pulmonary:     Effort: Pulmonary effort is normal. No respiratory distress.     Breath sounds: Normal breath sounds. No wheezing or rales.  Chest:     Chest wall: No tenderness.  Abdominal:     General: Bowel sounds are normal. There is no distension.     Palpations: Abdomen is soft. There is no mass.     Tenderness: There is no abdominal tenderness. There is no guarding or rebound.  Musculoskeletal:        General: No tenderness. Normal range of motion.     Cervical back: Normal range of motion.  Lymphadenopathy:     Cervical: No cervical adenopathy.  Skin:    General: Skin is warm and dry.     Findings: No rash.  Neurological:     Mental Status: He is alert and oriented to person, place, and time.     Cranial Nerves: No cranial nerve  deficit.     Motor: No abnormal muscle tone.     Coordination: Coordination normal.     Gait: Gait normal.     Deep Tendon Reflexes: Reflexes are normal and symmetric.  Psychiatric:        Behavior: Behavior normal.        Thought Content: Thought content normal.        Judgment: Judgment normal.   Testes - self exam  Lab Results  Component Value Date   WBC 6.0 05/31/2023   HGB 14.9 05/31/2023   HCT 43.3 05/31/2023   PLT 284.0 05/31/2023   GLUCOSE 95 05/31/2023   CHOL 122 05/31/2023   TRIG 33.0 05/31/2023   HDL 28.80 (L) 05/31/2023   LDLCALC 87 05/31/2023   ALT 28 05/31/2023   AST 21 05/31/2023   NA 140 05/31/2023   K 4.2 05/31/2023   CL 106 05/31/2023   CREATININE 0.96 05/31/2023   BUN 15 05/31/2023   CO2 28 05/31/2023    TSH 0.55 05/31/2023    No results found.  Assessment & Plan:   Problem List Items Addressed This Visit     Vitamin D  deficiency   Check Vit D      Relevant Orders   VITAMIN D  25 Hydroxy (Vit-D Deficiency, Fractures)   Weight gain   Cut back on carbs Intermittent fasting      Well adult exam - Primary   We discussed age appropriate health related issues, including available/recomended screening tests and vaccinations. We discussed a need for adhering to healthy diet and exercise. Labs were reviewed/ordered. All questions were answered. Age and sex related issues discussed (safe sex, seat belt use, etc.).  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     Follow-up: Return in about 1 year (around 06/05/2025) for Wellness Exam.  Marolyn Noel, MD

## 2024-06-05 NOTE — Assessment & Plan Note (Signed)
 Cut back on carbs Intermittent fasting

## 2024-06-10 ENCOUNTER — Other Ambulatory Visit

## 2024-06-10 DIAGNOSIS — E559 Vitamin D deficiency, unspecified: Secondary | ICD-10-CM | POA: Diagnosis not present

## 2024-06-10 DIAGNOSIS — Z0000 Encounter for general adult medical examination without abnormal findings: Secondary | ICD-10-CM

## 2024-06-10 DIAGNOSIS — Z13220 Encounter for screening for lipoid disorders: Secondary | ICD-10-CM | POA: Diagnosis not present

## 2024-06-10 DIAGNOSIS — T50B95A Adverse effect of other viral vaccines, initial encounter: Secondary | ICD-10-CM | POA: Diagnosis not present

## 2024-06-10 LAB — LIPID PANEL
Cholesterol: 159 mg/dL (ref 0–200)
HDL: 32.7 mg/dL — ABNORMAL LOW (ref >39.00)
LDL Cholesterol: 115 mg/dL — ABNORMAL HIGH (ref 0–99)
NonHDL: 126.22
Total CHOL/HDL Ratio: 5
Triglycerides: 54 mg/dL (ref 0.0–149.0)
VLDL: 10.8 mg/dL (ref 0.0–40.0)

## 2024-06-10 LAB — CBC WITH DIFFERENTIAL/PLATELET
Basophils Absolute: 0 K/uL (ref 0.0–0.1)
Basophils Relative: 0.7 % (ref 0.0–3.0)
Eosinophils Absolute: 0.1 K/uL (ref 0.0–0.7)
Eosinophils Relative: 2.4 % (ref 0.0–5.0)
HCT: 47.2 % (ref 39.0–52.0)
Hemoglobin: 16.1 g/dL (ref 13.0–17.0)
Lymphocytes Relative: 40.9 % (ref 12.0–46.0)
Lymphs Abs: 2.4 K/uL (ref 0.7–4.0)
MCHC: 34.1 g/dL (ref 30.0–36.0)
MCV: 91.2 fl (ref 78.0–100.0)
Monocytes Absolute: 0.5 K/uL (ref 0.1–1.0)
Monocytes Relative: 9.1 % (ref 3.0–12.0)
Neutro Abs: 2.7 K/uL (ref 1.4–7.7)
Neutrophils Relative %: 46.9 % (ref 43.0–77.0)
Platelets: 243 K/uL (ref 150.0–400.0)
RBC: 5.18 Mil/uL (ref 4.22–5.81)
RDW: 12.6 % (ref 11.5–15.5)
WBC: 5.8 K/uL (ref 4.0–10.5)

## 2024-06-10 LAB — COMPREHENSIVE METABOLIC PANEL WITH GFR
ALT: 33 U/L (ref 0–53)
AST: 22 U/L (ref 0–37)
Albumin: 4.4 g/dL (ref 3.5–5.2)
Alkaline Phosphatase: 59 U/L (ref 39–117)
BUN: 13 mg/dL (ref 6–23)
CO2: 26 meq/L (ref 19–32)
Calcium: 9.2 mg/dL (ref 8.4–10.5)
Chloride: 104 meq/L (ref 96–112)
Creatinine, Ser: 1.05 mg/dL (ref 0.40–1.50)
GFR: 100.47 mL/min (ref >60.00)
Glucose, Bld: 93 mg/dL (ref 70–99)
Potassium: 4.1 meq/L (ref 3.5–5.1)
Sodium: 138 meq/L (ref 135–145)
Total Bilirubin: 0.5 mg/dL (ref 0.2–1.2)
Total Protein: 6.9 g/dL (ref 6.0–8.3)

## 2024-06-10 LAB — URINALYSIS
Bilirubin Urine: NEGATIVE
Hgb urine dipstick: NEGATIVE
Ketones, ur: NEGATIVE
Leukocytes,Ua: NEGATIVE
Nitrite: NEGATIVE
Specific Gravity, Urine: 1.02 (ref 1.000–1.030)
Total Protein, Urine: NEGATIVE
Urine Glucose: NEGATIVE
Urobilinogen, UA: 0.2 (ref 0.0–1.0)
pH: 6 (ref 5.0–8.0)

## 2024-06-10 LAB — TSH: TSH: 1.41 u[IU]/mL (ref 0.35–5.50)

## 2024-06-10 LAB — VITAMIN D 25 HYDROXY (VIT D DEFICIENCY, FRACTURES): VITD: 30.31 ng/mL (ref 30.00–100.00)

## 2024-06-11 ENCOUNTER — Ambulatory Visit: Payer: Self-pay | Admitting: Internal Medicine

## 2024-06-26 LAB — RABIES VIRUS ANTIBODY TITER: Rabies Response: 0.6 [IU]/mL

## 2024-11-04 ENCOUNTER — Telehealth: Payer: Self-pay | Admitting: Internal Medicine

## 2024-11-04 NOTE — Telephone Encounter (Signed)
 Patient brought in form to be filled out for his work.  Please call patient when it is ready - Form was placed in Dr. Alexandra box up front.

## 2024-11-06 NOTE — Telephone Encounter (Signed)
 Form has been placed on providers desk

## 2024-11-06 NOTE — Telephone Encounter (Unsigned)
 Copied from CRM [id number]. Topic: General - Other >> Nov 06, 2024  9:08 AM Eva FALCON wrote: Reason for CRM: Pt called in to check on status of his form he dropped off Monday. I advise we were still waiting for provider to fill out. He says he needs it today or before 11 am tomorrow as he has his on boarding appointment at 11 am. Call back number [phone].

## 2024-11-13 NOTE — Telephone Encounter (Signed)
 Form being placed up front for pick up

## 2024-11-13 NOTE — Telephone Encounter (Signed)
It was done.  Thanks °
